# Patient Record
Sex: Female | Born: 2000 | Race: White | Hispanic: No | Marital: Single | Smoking: Never smoker
Health system: Southern US, Community
[De-identification: ages and names within clinical notes are randomized; demographics above are authoritative.]

## PROBLEM LIST (undated history)

## (undated) HISTORY — PX: ADENOIDECTOMY: SUR15

## (undated) HISTORY — PX: TONSILLECTOMY: SUR1361

---
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
## 2023-12-13 ENCOUNTER — Encounter: Payer: Self-pay | Admitting: Family

## 2023-12-13 ENCOUNTER — Ambulatory Visit: Attending: Family

## 2023-12-13 ENCOUNTER — Ambulatory Visit: Admitting: Family

## 2023-12-13 VITALS — BP 103/67 | HR 75 | Temp 98.1°F | Ht 62.0 in | Wt 121.4 lb

## 2023-12-13 DIAGNOSIS — R002 Palpitations: Secondary | ICD-10-CM

## 2023-12-13 DIAGNOSIS — R000 Tachycardia, unspecified: Secondary | ICD-10-CM

## 2023-12-13 DIAGNOSIS — N920 Excessive and frequent menstruation with regular cycle: Secondary | ICD-10-CM | POA: Diagnosis not present

## 2023-12-13 DIAGNOSIS — Z1159 Encounter for screening for other viral diseases: Secondary | ICD-10-CM | POA: Diagnosis not present

## 2023-12-13 DIAGNOSIS — R0789 Other chest pain: Secondary | ICD-10-CM | POA: Diagnosis not present

## 2023-12-13 DIAGNOSIS — Z114 Encounter for screening for human immunodeficiency virus [HIV]: Secondary | ICD-10-CM

## 2023-12-13 LAB — CBC WITH DIFFERENTIAL/PLATELET
Basophils Absolute: 0.1 10*3/uL (ref 0.0–0.1)
Basophils Relative: 0.8 % (ref 0.0–3.0)
Eosinophils Absolute: 0.1 10*3/uL (ref 0.0–0.7)
Eosinophils Relative: 2 % (ref 0.0–5.0)
HCT: 40.3 % (ref 36.0–46.0)
Hemoglobin: 13.7 g/dL (ref 12.0–15.0)
Lymphocytes Relative: 34.5 % (ref 12.0–46.0)
Lymphs Abs: 2.5 10*3/uL (ref 0.7–4.0)
MCHC: 34 g/dL (ref 30.0–36.0)
MCV: 88.4 fl (ref 78.0–100.0)
Monocytes Absolute: 0.5 10*3/uL (ref 0.1–1.0)
Monocytes Relative: 7.6 % (ref 3.0–12.0)
Neutro Abs: 3.9 10*3/uL (ref 1.4–7.7)
Neutrophils Relative %: 55.1 % (ref 43.0–77.0)
Platelets: 287 10*3/uL (ref 150.0–400.0)
RBC: 4.55 Mil/uL (ref 3.87–5.11)
RDW: 12.6 % (ref 11.5–14.6)
WBC: 7.2 10*3/uL (ref 4.5–10.5)

## 2023-12-13 NOTE — Patient Instructions (Addendum)
 Welcome to [facility], It was a pleasure meeting you today!   As discussed, I have sent an order for the Ziopatch to monitor your heart rhythm. This should be sent directly to you via mail. After you have returned the device it will be read by Cardiology and I will review the results with you via MyChart.  Any questions or concerns let us  know and also if you have not received the device by next week.     PLEASE NOTE: If you had any LAB tests please let us  know if you have not heard back within a few days. You may see your results on MyChart before we have a chance to review them but we will give you a call once they are reviewed by us . If we ordered any REFERRALS today, please let us  know if you have not heard from their office within the next week.  Let us  know through MyChart if you are needing REFILLS, or have your pharmacy send us  the request. You can also use MyChart to communicate with me or any office staff.  Please try these tips to maintain a healthy lifestyle: It is important that you exercise regularly at least 30 minutes 5 times a week. Think about what you will eat, plan ahead. Choose whole foods, & think  "clean, green, fresh or frozen" over canned, processed or packaged foods which are more sugary, salty, and fatty. 70 to 75% of food eaten should be fresh vegetables and protein. 2-3  meals daily with healthy snacks between meals, but must be whole fruit, protein or vegetables. Aim to eat over a 10 hour period when you are active, for example, 7am to 5pm, and then STOP after your last meal of the day, drinking only water.  Shorter eating windows, 6-8 hours, are showing benefits in heart disease and blood sugar regulation. Drink water every day! Shoot for 64 ounces daily = 8 cups, no other drink is as healthy! Fruit juice is best enjoyed in a healthy way, by EATING the fruit.

## 2023-12-13 NOTE — Progress Notes (Signed)
 New Patient Office Visit  Subjective:  Patient ID: Angel Brady, female    DOB: 09/05/2000  Age: 23 y.o. MRN: [id number]  CC:  Chief Complaint  Patient presents with   New Patient (Initial Visit)   Palpitations    Pt c/o palpitation,SOB, chest tightness and pain when swallowing at times. Present for Angel couple of months.     HPI Angel Brady presents for establishing care today.  Discussed the use of AI scribe software for clinical note transcription with the patient, who gave verbal consent to proceed.  History of Present Illness Angel Brady, Angel Brady present with her mother and Angel past medical history of heavy menstrual periods, Raynaud's work up, and Angel previous evaluation for dysautonomia, presents with Angel month-long history of palpitations, shortness of breath, chest tightness, and chest pain. The palpitations are described as Angel fast heartbeat that sometimes feels like it is skipping Angel beat, leading to Angel sensation of breathlessness. These episodes occur throughout the day and night, with no identifiable triggers. The chest pain is described as Angel burning sensation, which is present upon waking and persists throughout the day. The patient also reports difficulty sleeping due to the sensation of Angel fast heartbeat. There is no associated nausea, and the symptoms do not appear to be related to food intake or stress. The patient denies any significant caffeine intake.  Assessment & Plan Chest Pain and Burning Sensation - Intermittent chest pain and burning sensation, possibly GERD or cardiac-related. Symptoms occur postprandially and during activities. NSR during exam, no tachycardia, pt denies sx during visit. - Order Zio patch heart monitor to assess cardiac rhythm and palpitations. - Minimize caffeine intake; ok to continue to exercise and do routine activities. - Encourage hydration with non-caffeinated beverages, up to 2 liters daily. - Ensure regular meals, at least two good  meals Angel day. - Seek ED if any worsening chest pain, dizziness, nausea, or sustained heart rate >120bpm.   Palpitations/Tachycardia - Palpitations, SOB and tachycardia with heart rates up to 120 bpm, possibly due to anxiety, arrhythmias, or anemia from heavy menstrual bleeding. No hx of Asthma, denies coughing. - Order Zio patch heart monitor to assess cardiac rhythm and palpitations. - Minimize caffeine intake. - Encourage hydration with non-caffeinated beverages. - Ensure regular meals, at least two good meals Angel day.  Heavy Menstrual Bleeding - Heavy menstrual bleeding may contribute to anemia, exacerbating palpitations and shortness of breath. Denies headaches, PICA sx or fatigue.  - Order CBC to assess for anemia.   Subjective:    Outpatient Medications Prior to Visit  Medication Sig Dispense Refill   Cholecalciferol (VITAMIN D PO) Take 1 tablet by mouth daily.     CLARAVIS 40 MG capsule Take 40 mg by mouth 2 (two) times daily.  0   gentamicin  cream (GARAMYCIN ) 0.1 % Apply 1 application topically 3 (three) times daily. 30 g 1   HYDROcodone -acetaminophen  (NORCO/VICODIN) 5-325 MG tablet Take 1 tablet by mouth every 6 (six) hours as needed. 10 tablet 0   mupirocin ointment (BACTROBAN) 2 % APPLY TWICE Angel DAY FOR 7 DAYS  0   Ascorbic Acid (VITAMIN C PO) Take 1 tablet by mouth daily. (Patient not taking: Reported on 12/13/2023)     B Complex Vitamins (VITAMIN-B COMPLEX PO) Take 1 tablet by mouth daily. (Patient not taking: Reported on 12/13/2023)     cephALEXin  (KEFLEX ) 500 MG capsule Take 1 capsule (500 mg total) by mouth 2 (two) times daily. (Patient not taking:  Reported on 12/13/2023) 20 capsule 0   No facility-administered medications prior to visit.   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  Constitutional:      Appearance: Normal appearance.  Cardiovascular:     Rate and Rhythm: Normal rate and regular rhythm.  Pulmonary:     Effort: Pulmonary effort is normal.     Breath sounds: Normal breath sounds.  Musculoskeletal:        General: Normal range of motion.  Skin:    General: Skin is warm and dry.  Neurological:     Mental Status: She is alert.  Psychiatric:        Mood and Affect: Mood normal.        Behavior: Behavior normal.    Versa Gore, NP

## 2023-12-13 NOTE — Progress Notes (Unsigned)
 EP to read.

## 2023-12-14 ENCOUNTER — Encounter: Payer: Self-pay | Admitting: Family

## 2023-12-15 LAB — HIV ANTIBODY (ROUTINE TESTING W REFLEX): HIV 1&2 Ab, 4th Generation: NONREACTIVE

## 2023-12-15 LAB — HEPATITIS C ANTIBODY: Hepatitis C Ab: NONREACTIVE

## 2024-01-19 DIAGNOSIS — R0789 Other chest pain: Secondary | ICD-10-CM | POA: Diagnosis not present

## 2024-01-19 DIAGNOSIS — R000 Tachycardia, unspecified: Secondary | ICD-10-CM | POA: Diagnosis not present

## 2024-01-19 DIAGNOSIS — R002 Palpitations: Secondary | ICD-10-CM | POA: Diagnosis not present

## 2024-04-16 ENCOUNTER — Ambulatory Visit (INDEPENDENT_AMBULATORY_CARE_PROVIDER_SITE_OTHER): Admitting: Family

## 2024-04-16 ENCOUNTER — Encounter: Payer: Self-pay | Admitting: Family

## 2024-04-16 VITALS — BP 134/86 | HR 72 | Temp 98.1°F | Ht 62.0 in | Wt 120.6 lb

## 2024-04-16 DIAGNOSIS — Z020 Encounter for examination for admission to educational institution: Secondary | ICD-10-CM | POA: Diagnosis not present

## 2024-04-16 NOTE — Progress Notes (Signed)
 Patient ID: Angel Brady, female    DOB: 2000-11-06, 22 y.o.   MRN: [id number]  Chief Complaint  Patient presents with  . Annual Exam    School physical form   Discussed the use of AI scribe software for clinical note transcription with the patient, who gave verbal consent to proceed.  History of Present Illness   Angel Brady is a 23 year old female who presents for a college physical with ear symptoms. She is accompanied by her mother.  Aural fullness and sensation - Intermittent sensation in the left ear described as 'swimming' or being underwater - Episodes last approximately half a day before resolving - Symptom duration is two weeks - Occasional sensation of congestion in the left ear - No associated otalgia  Associated and constitutional symptoms - No recent swimming - No recent upper respiratory infection - No cough, persistent fever, night sweats, shortness of breath, chest pain, loss of appetite, unexplained weight loss, or fatigue       Assessment & Plan:     School physical Completed school physical form and answered TB questions with no symptoms of active tuberculosis. TB testing not required unless mandated by school. Vision exam needed by eye doctor. - Signed school physical form indicating no symptoms of active tuberculosis and normal physical. - Advised to obtain vision exam documentation from eye doctor.  Eustachian tube dysfunction, left ear Intermittent ear fullness and 'swimming' sensation likely due to eustachian tube dysfunction. No pain or constant symptoms. - Advised to monitor symptoms for a few more weeks. - Instructed to perform Valsalva maneuver if comfortable and helpful. - Advised to return if symptoms worsen or pain develops.     Subjective:    No outpatient medications prior to visit.   No facility-administered medications prior to visit.   Past Medical History:  Diagnosis Date  . 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 Chronic  bilateral low back pain without sciatica 08/22/2018  . Cyanosis 06/26/2018  . Dysautonomia orthostatic hypotension syndrome 08/22/2018  . Flat feet, bilateral 08/22/2018  . Poor posture 08/22/2018   Past Surgical History:  Procedure Laterality Date  . ADENOIDECTOMY    . TONSILLECTOMY     Allergies  Allergen Reactions  . Latex Rash  . Tape Rash    Not tolerated (rash & redness)      Objective:    Physical Exam Vitals and nursing note reviewed.  Constitutional:      Appearance: Normal appearance.  HENT:     Head: Normocephalic.     Right Ear: Tympanic membrane normal.     Left Ear: Tympanic membrane normal.     Nose: Nose normal.     Mouth/Throat:     Mouth: Mucous membranes are moist.  Eyes:     Pupils: Pupils are equal, round, and reactive to light.  Cardiovascular:     Rate and Rhythm: Normal rate and regular rhythm.  Pulmonary:     Effort: Pulmonary effort is normal.     Breath sounds: Normal breath sounds.  Musculoskeletal:        General: Normal range of motion.     Cervical back: Normal range of motion.  Lymphadenopathy:     Cervical: No cervical adenopathy.  Skin:    General: Skin is warm and dry.  Neurological:     Mental Status: She is alert.  Psychiatric:        Mood and Affect: Mood normal.        Behavior: Behavior normal.  BP 134/86 (BP Location: Right Arm, Patient Position: Sitting, Cuff Size: Normal)   Pulse 72   Temp 98.1 F (36.7 C) (Temporal)   Ht 5' 2 (1.575 m)   Wt 120 lb 9.6 oz (54.7 kg)   LMP 04/09/2024 (Exact Date)   SpO2 100%   BMI 22.06 kg/m  Wt Readings from Last 3 Encounters:  04/16/24 120 lb 9.6 oz (54.7 kg)  12/13/23 121 lb 6 oz (55.1 kg)  05/19/19 123 lb 3.8 oz (55.9 kg) (60%, Z= 0.25)*   * Growth percentiles are based on CDC (Girls, 2-20 Years) data.     Lucius Krabbe, NP

## 2024-05-21 ENCOUNTER — Ambulatory Visit
Admission: RE | Admit: 2024-05-21 | Discharge: 2024-05-21 | Disposition: A | Discharge status: Home / Self Care | Source: Ambulatory Visit | Attending: Family Medicine | Admitting: Family Medicine

## 2024-05-21 VITALS — BP 127/81 | HR 75 | Temp 98.6°F | Resp 16

## 2024-05-21 DIAGNOSIS — J029 Acute pharyngitis, unspecified: Secondary | ICD-10-CM | POA: Diagnosis not present

## 2024-05-21 DIAGNOSIS — R070 Pain in throat: Secondary | ICD-10-CM

## 2024-05-21 LAB — POCT MONO SCREEN (KUC): Mono, POC: NEGATIVE

## 2024-05-21 LAB — POCT RAPID STREP A (OFFICE): Rapid Strep A Screen: NEGATIVE

## 2024-05-21 MED ORDER — AMOXICILLIN 875 MG PO TABS: 875.0000 mg | ORAL_TABLET | Freq: Two times a day (BID) | ORAL | 0 refills | Status: AC

## 2024-05-21 NOTE — ED Triage Notes (Signed)
 Pt reports chemical/metal taste x 2 weeks, feels like pus, drainage;  difficulty swallowing, throat feels swelling x 2 days. Salt water gargle gives no relief.

## 2024-05-21 NOTE — ED Provider Notes (Signed)
 [facility]  Note:  This document was prepared using Conservation officer, historic buildings and may include unintentional dictation errors.  MRN: [id number] DOB: 05-Feb-2001  Subjective:   Angel Brady is a 23 y.o. female presenting for 2 week history of persistent and worsening throat pain, painful swallowing.  Feels that she can taste drainage, infection in her throat.  Has had intermittent runny and stuffy nose.  No fever, sinus pain, ear pain, throat swelling, cough, chest pain, shortness of breath or wheezing, fatigue, body pains.  No rashes.  No current facility-administered medications for this encounter. No current outpatient medications on file.   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 No pertinent family history.  Social History   Tobacco Use   Smoking status: Never   Smokeless tobacco: Never  Vaping Use   Vaping status: Never Used  Substance Use Topics   Alcohol use: Never   Drug use: Never    ROS   Objective:   Vitals: BP 127/81 (BP Location: Right Arm)   Pulse 75   Temp 98.6 F (37 C) (Oral)   Resp 16   LMP  (Within Weeks) Comment: 1 week  SpO2 98%   Physical Exam Constitutional:      General: She is not in acute distress.    Appearance: Normal appearance. She is well-developed. She is not ill-appearing, toxic-appearing or diaphoretic.  HENT:     Head: Normocephalic and atraumatic.     Nose: Nose normal.     Mouth/Throat:     Mouth: Mucous membranes are moist.     Pharynx: No  pharyngeal swelling, oropharyngeal exudate, posterior oropharyngeal erythema or uvula swelling.     Tonsils: No tonsillar exudate or tonsillar abscesses. 0 on the right. 0 on the left.  Eyes:     General: No scleral icterus.       Right eye: No discharge.        Left eye: No discharge.     Extraocular Movements: Extraocular movements intact.  Cardiovascular:     Rate and Rhythm: Normal rate.  Pulmonary:     Effort: Pulmonary effort is normal.  Skin:    General: Skin is warm and dry.  Neurological:     General: No focal deficit present.     Mental Status: She is alert and oriented to person, place, and time.  Psychiatric:        Mood and Affect: Mood normal.        Behavior: Behavior normal.     Results for orders placed or performed during the hospital encounter of 05/21/24 (from the past 24 hours)  POCT rapid strep A     Status: Normal   Collection Time: 05/21/24  1:41 PM  Result Value Ref Range   Rapid Strep A Screen Negative Negative  POCT mono screen     Status: Normal   Collection Time: 05/21/24  2:00 PM  Result Value Ref Range   Mono, POC Negative Negative  Assessment and Plan :   PDMP not reviewed this encounter.  1. Acute pharyngitis, unspecified etiology   2. Throat pain    Will manage for acute pharyngitis with amoxicillin and supportive care.  Counseled patient on potential for adverse effects with medications prescribed/recommended today, ER and return-to-clinic precautions discussed, patient verbalized understanding.    Christopher Savannah, PA-C 05/21/24 1455
